# Patient Record
Sex: Male | Born: 1975 | Race: White | Hispanic: No | Smoking: Former smoker
Health system: Southern US, Community
[De-identification: ages and names within clinical notes are randomized; demographics above are authoritative.]

## PROBLEM LIST (undated history)

## (undated) DIAGNOSIS — Z9889 Other specified postprocedural states: Secondary | ICD-10-CM

## (undated) DIAGNOSIS — R931 Abnormal findings on diagnostic imaging of heart and coronary circulation: Secondary | ICD-10-CM

## (undated) HISTORY — PX: KNEE ARTHROSCOPY: SUR90

## (undated) HISTORY — DX: Other specified postprocedural states: Z98.890

## (undated) HISTORY — PX: NASAL SINUS SURGERY: SHX719

## (undated) HISTORY — DX: Abnormal findings on diagnostic imaging of heart and coronary circulation: R93.1

---

## 1998-08-15 HISTORY — PX: WISDOM TOOTH EXTRACTION: SHX21

## 2002-09-20 ENCOUNTER — Encounter: Admission: RE | Admit: 2002-09-20 | Discharge: 2002-10-09 | Payer: Self-pay | Admitting: Orthopedic Surgery

## 2008-08-15 DIAGNOSIS — Z9889 Other specified postprocedural states: Secondary | ICD-10-CM

## 2008-08-15 HISTORY — DX: Other specified postprocedural states: Z98.890

## 2010-01-04 LAB — HM SIGMOIDOSCOPY

## 2010-03-23 ENCOUNTER — Ambulatory Visit: Payer: Self-pay | Admitting: Cardiology

## 2010-03-23 DIAGNOSIS — R931 Abnormal findings on diagnostic imaging of heart and coronary circulation: Secondary | ICD-10-CM

## 2010-03-23 HISTORY — DX: Abnormal findings on diagnostic imaging of heart and coronary circulation: R93.1

## 2010-11-01 ENCOUNTER — Ambulatory Visit (INDEPENDENT_AMBULATORY_CARE_PROVIDER_SITE_OTHER): Payer: PRIVATE HEALTH INSURANCE | Admitting: Internal Medicine

## 2010-11-01 DIAGNOSIS — K5900 Constipation, unspecified: Secondary | ICD-10-CM

## 2014-09-27 LAB — VITAMIN D 25 HYDROXY (VIT D DEFICIENCY, FRACTURES): Vit D, 25-Hydroxy: 50.2

## 2016-11-07 ENCOUNTER — Encounter (HOSPITAL_COMMUNITY): Payer: Self-pay | Admitting: Emergency Medicine

## 2016-11-07 ENCOUNTER — Ambulatory Visit (HOSPITAL_COMMUNITY)
Admission: EM | Admit: 2016-11-07 | Discharge: 2016-11-07 | Disposition: A | Discharge status: Home / Self Care | Payer: 59

## 2016-11-07 DIAGNOSIS — J40 Bronchitis, not specified as acute or chronic: Secondary | ICD-10-CM

## 2016-11-07 DIAGNOSIS — H65193 Other acute nonsuppurative otitis media, bilateral: Secondary | ICD-10-CM

## 2016-11-07 MED ORDER — AMOXICILLIN 500 MG PO CAPS: 1000.0000 mg | ORAL_CAPSULE | Freq: Two times a day (BID) | ORAL | 0 refills | Status: DC

## 2016-11-07 MED ORDER — ALBUTEROL SULFATE HFA 108 (90 BASE) MCG/ACT IN AERS: 1.0000 | INHALATION_SPRAY | Freq: Four times a day (QID) | RESPIRATORY_TRACT | 0 refills | Status: DC | PRN

## 2016-11-07 MED ORDER — GUAIFENESIN-CODEINE 100-10 MG/5ML PO SOLN: 10.0000 mL | Freq: Three times a day (TID) | ORAL | 0 refills | Status: DC | PRN

## 2016-11-07 NOTE — ED Triage Notes (Signed)
The patient presented to the [facility] with a complaint of a sore throat, a cough and bilateral ear pressure x 5 days.

## 2016-11-07 NOTE — ED Provider Notes (Signed)
CSN: [id number]     Arrival date & time 11/07/16  1312 History   First MD Initiated Contact with Patient 11/07/16 1500     Chief Complaint  Patient presents with  . Sore Throat   (Consider location/radiation/quality/duration/timing/severity/associated sxs/prior Treatment) The history is provided by the patient.  Sore Throat  This is a new problem. The current episode started more than 2 days ago (5 days ago). The problem has been gradually worsening. Pertinent negatives include no shortness of breath. Treatments tried: Tessalon perls. The treatment provided no relief.    History reviewed. No pertinent past medical history. Past Surgical History:  Procedure Laterality Date  . NASAL SINUS SURGERY     History reviewed. No pertinent family history. Social History  Substance Use Topics  . Smoking status: Never Smoker  . Smokeless tobacco: Never Used  . Alcohol use No    Review of Systems  Constitutional: Positive for fatigue.  HENT: Positive for congestion, ear pain, postnasal drip and sore throat. Negative for trouble swallowing.   Respiratory: Positive for cough. Negative for chest tightness, shortness of breath and wheezing.     Allergies  Patient has no known allergies.  Home Medications   Prior to Admission medications   Medication Sig Start Date End Date Taking? Authorizing Provider  Magnesium 100 MG CAPS Take by mouth.   Yes Historical Provider, MD  albuterol (PROVENTIL HFA;VENTOLIN HFA) 108 (90 Base) MCG/ACT inhaler Inhale 1-2 puffs into the lungs every 6 (six) hours as needed for wheezing or shortness of breath. 11/07/16   Cohl Behrens, NP  amoxicillin (AMOXIL) 500 MG capsule Take 2 capsules (1,000 mg total) by mouth 2 (two) times daily. 11/07/16   Lailani Tool, NP  guaiFENesin-codeine 100-10 MG/5ML syrup Take 10 mLs by mouth 3 (three) times daily as needed for cough. 11/07/16   Toshika Parrow, NP   Meds Ordered and Administered this Visit  Medications - No  data to display  BP 111/78 (BP Location: Right Arm)   Pulse 95   Temp 98.3 F (36.8 C) (Oral)   Resp 18   SpO2 96%  No data found.   Physical Exam  Constitutional: He appears well-developed and well-nourished. He appears distressed.  HENT:  Right Ear: Hearing and ear canal normal. Tympanic membrane is injected (B/L erythemaotous TM , RIght worse than left ) and bulging.  Left Ear: Hearing and ear canal normal. Tympanic membrane is injected. Tympanic membrane is not bulging.  Mouth/Throat: Oropharynx is clear and moist. No oropharyngeal exudate, posterior oropharyngeal edema, posterior oropharyngeal erythema or tonsillar abscesses.  Eyes: Pupils are equal, round, and reactive to light.  Neck: Neck supple.  Cardiovascular: Normal rate, regular rhythm and normal heart sounds.   Pulmonary/Chest: Effort normal and breath sounds normal. No respiratory distress. He has no wheezes. He has no rales. He exhibits no tenderness.  Dry, persistent, non productive cough not relieved with Tessalon perls     Urgent Care Course     Procedures (including critical care time)  Labs Review Labs Reviewed - No data to display  Imaging Review No results found.   Visual Acuity Review  Right Eye Distance:   Left Eye Distance:   Bilateral Distance:    Right Eye Near:   Left Eye Near:    Bilateral Near:         MDM   1. Other acute nonsuppurative otitis media of both ears, recurrence not specified   2. Bronchitis       Rayetta Veith,  NP 11/07/16 1524

## 2016-11-07 NOTE — Discharge Instructions (Signed)
Saline nasal rinse and Allegra OTC as directed. Tylenol/Motrin as needed for pain/fever

## 2016-12-02 ENCOUNTER — Ambulatory Visit (INDEPENDENT_AMBULATORY_CARE_PROVIDER_SITE_OTHER): Payer: 59

## 2016-12-02 ENCOUNTER — Encounter: Payer: Self-pay | Admitting: Physician Assistant

## 2016-12-02 ENCOUNTER — Ambulatory Visit (INDEPENDENT_AMBULATORY_CARE_PROVIDER_SITE_OTHER): Payer: 59 | Admitting: Physician Assistant

## 2016-12-02 VITALS — BP 118/78 | HR 66 | Temp 98.1°F | Ht 70.5 in | Wt 179.2 lb

## 2016-12-02 DIAGNOSIS — R5383 Other fatigue: Secondary | ICD-10-CM | POA: Diagnosis not present

## 2016-12-02 DIAGNOSIS — J069 Acute upper respiratory infection, unspecified: Secondary | ICD-10-CM

## 2016-12-02 DIAGNOSIS — R05 Cough: Secondary | ICD-10-CM | POA: Diagnosis not present

## 2016-12-02 DIAGNOSIS — J029 Acute pharyngitis, unspecified: Secondary | ICD-10-CM | POA: Diagnosis not present

## 2016-12-02 LAB — CBC WITH DIFFERENTIAL/PLATELET
BASOS PCT: 0.5 % (ref 0.0–3.0)
Basophils Absolute: 0 10*3/uL (ref 0.0–0.1)
EOS PCT: 2.5 % (ref 0.0–5.0)
Eosinophils Absolute: 0.1 10*3/uL (ref 0.0–0.7)
HEMATOCRIT: 47.6 % (ref 39.0–52.0)
HEMOGLOBIN: 16.3 g/dL (ref 13.0–17.0)
LYMPHS PCT: 24.3 % (ref 12.0–46.0)
Lymphs Abs: 1.1 10*3/uL (ref 0.7–4.0)
MCHC: 34.2 g/dL (ref 30.0–36.0)
MCV: 90.7 fl (ref 78.0–100.0)
Monocytes Absolute: 0.5 10*3/uL (ref 0.1–1.0)
Monocytes Relative: 10.8 % (ref 3.0–12.0)
Neutro Abs: 2.7 10*3/uL (ref 1.4–7.7)
Neutrophils Relative %: 61.9 % (ref 43.0–77.0)
Platelets: 196 10*3/uL (ref 150.0–400.0)
RBC: 5.25 Mil/uL (ref 4.22–5.81)
RDW: 12.7 % (ref 11.5–15.5)
WBC: 4.4 10*3/uL (ref 4.0–10.5)

## 2016-12-02 LAB — BASIC METABOLIC PANEL
BUN: 12 mg/dL (ref 6–23)
CHLORIDE: 103 meq/L (ref 96–112)
CO2: 33 meq/L — AB (ref 19–32)
Calcium: 9.3 mg/dL (ref 8.4–10.5)
Creatinine, Ser: 0.93 mg/dL (ref 0.40–1.50)
GFR: 94.16 mL/min (ref >60.00)
Glucose, Bld: 95 mg/dL (ref 70–99)
POTASSIUM: 4 meq/L (ref 3.5–5.1)
Sodium: 139 mEq/L (ref 135–145)

## 2016-12-02 NOTE — Progress Notes (Signed)
Keith Choi is a 41 y.o. male here to Establish Care and c/o cough and sore throat x 4-5 weeks.  I acted as a Neurosurgeon for [employer], PA-C Corky Mull, LPN  History of Present Illness:   Chief Complaint  Patient presents with  . Establish Care  . Cough    expectorating green sputum  . Sore Throat  . Nasal Congestion  . Chest congestion  . Headache    1-2 x's per week  . Fatigue    Acute Concerns: URI -- went to urgent care on 11/07/16 was diagnosed with bilateral ear infection and bronchitis; prescribed an albuterol inhaler, amoxicillin and cheratussin. He states that he called a few days later to see if there was a stronger cough medicine that he could take and they gave him something else (I don't have record of this, likely either Hycodan or Tussionex) and he was also given a 5 day course of oral steroids but he did not take them. Over the past few days, his symptoms have continued to worsen, with deep cough and congestion. States that his appetite is good, well hydrated. He does endorse significant fatigue, but also admits that he works 50-60 hours a week and is in school full time. No hx of asthma. Was diagnosed with PNA in 2002 -- was able to treat outpatient.  Health Maintenance: Immunizations -- UTD Caffeine intake -- none Sleep habits -- sleeps 5-6 hours Weight -- Weight: 179 lb 4 oz (81.3 kg)   Depression screen PHQ 2/9 12/02/2016  Decreased Interest 0  Down, Depressed, Hopeless 0  PHQ - 2 Score 0    Other providers/specialists: Cardiologist -- saw once or twice for PVC's in 2010 Gastroenterologist -- saw once or twice for IBS - C Dermatologist -- acne    PMHx, SurgHx, SocialHx, Medications, and Allergies were reviewed in the Visit Navigator and updated as appropriate.  Current Medications:   Current Outpatient Prescriptions:  .  albuterol (PROVENTIL HFA;VENTOLIN HFA) 108 (90 Base) MCG/ACT inhaler, Inhale 1-2 puffs into the lungs every 6 (six) hours as  needed for wheezing or shortness of breath., Disp: 1 Inhaler, Rfl: 0 .  amoxicillin (AMOXIL) 500 MG capsule, Take 2 capsules (1,000 mg total) by mouth 2 (two) times daily., Disp: 40 capsule, Rfl: 0 .  guaiFENesin-codeine 100-10 MG/5ML syrup, Take 10 mLs by mouth 3 (three) times daily as needed for cough., Disp: 120 mL, Rfl: 0 .  Magnesium 100 MG CAPS, Take by mouth., Disp: , Rfl:    Review of Systems:   Review of Systems  Constitutional: Positive for malaise/fatigue.  HENT: Positive for congestion and sore throat.   Eyes: Negative.   Respiratory: Positive for cough and sputum production.   Cardiovascular: Negative.   Gastrointestinal: Negative.   Genitourinary: Negative.   Musculoskeletal: Negative.   Skin: Negative.   Neurological: Positive for headaches.  Endo/Heme/Allergies: Negative.   Psychiatric/Behavioral: Negative.     Vitals:   Vitals:   12/02/16 0832  BP: 118/78  Pulse: 66  Temp: 98.1 F (36.7 C)  TempSrc: Oral  SpO2: 97%  Weight: 179 lb 4 oz (81.3 kg)  Height: 5' 10.5" (1.791 m)     Body mass index is 25.36 kg/m.  Physical Exam:   Physical Exam  Constitutional: He appears well-developed. He is cooperative.  Non-toxic appearance. He does not have a sickly appearance. He does not appear ill. No distress.  HENT:  Head: Normocephalic and atraumatic.  Right Ear: Tympanic membrane, external ear and  ear canal normal. Tympanic membrane is not erythematous, not retracted and not bulging.  Left Ear: Tympanic membrane, external ear and ear canal normal. Tympanic membrane is not erythematous, not retracted and not bulging.  Nose: Nose normal. Right sinus exhibits no maxillary sinus tenderness and no frontal sinus tenderness. Left sinus exhibits no maxillary sinus tenderness and no frontal sinus tenderness.  Mouth/Throat: Uvula is midline. No posterior oropharyngeal edema or posterior oropharyngeal erythema. Tonsils are 0 on the right. Tonsils are 0 on the left.  Eyes:  Conjunctivae and lids are normal.  Neck: Trachea normal.  Cardiovascular: Normal rate, regular rhythm, S1 normal, S2 normal and normal heart sounds.   Pulmonary/Chest: Effort normal. No respiratory distress. He has no decreased breath sounds. He has no wheezes. He has rhonchi in the right upper field, the right middle field and the right lower field. He has no rales.  Lymphadenopathy:    He has no cervical adenopathy.  Neurological: He is alert.  Skin: Skin is warm, dry and intact.  Psychiatric: He has a normal mood and affect. His speech is normal and behavior is normal.  Nursing note and vitals reviewed.    Assessment and Plan:    Nathaniel was seen today for establish care, cough, sore throat, nasal congestion, chest congestion, headache and fatigue.  Diagnoses and all orders for this visit:  Fatigue, unspecified type Patient is concerned he may have mono. Will draw EBV panel. I suspect that he is getting inadequate rest, he works very long hours and is in school full time. We discussed importance of getting rest and staying hydrated. Will check labs. -     Basic metabolic panel -     CBC with Differential/Platelet -     Epstein-Barr virus VCA antibody panel  Upper respiratory tract infection, unspecified type Will obtain chest xray. Suspect bronchitis however want to r/o PNA. Will also obtain labs. Start Mucinex and steroid. Use inhaler as needed for coughing fits. Advised patient to let us know if he develops worsening symptoms.  -     Basic metabolic panel -     CBC with Differential/Platelet -     DG Chest 2 View; Future  Pharyngitis, unspecified etiology Patient is concerned he may have strep. Will check culture. -     Culture, Group A Strep    . Reviewed expectations re: course of current medical issues. . Discussed self-management of symptoms. . Outlined signs and symptoms indicating need for more acute intervention. . Patient verbalized understanding and all questions  were answered. . See orders for this visit as documented in the electronic medical record. . Patient received an After-Visit Summary.  CMA or LPN served as scribe during this visit. History, Physical, and Plan performed by medical provider. Documentation and orders reviewed and attested to.  Jarold Motto, PA-C

## 2016-12-02 NOTE — Patient Instructions (Signed)
It was great meeting you today!  Start mucinex and steroids. Cough medicine as needed. Stay hydrated and rest. We will call you with your lab results.  Please let us know if you develop fever, worsening shortness of breath or cough.   Upper Respiratory Infection, Adult Most upper respiratory infections (URIs) are a viral infection of the air passages leading to the lungs. A URI affects the nose, throat, and upper air passages. The most common type of URI is nasopharyngitis and is typically referred to as "the common cold." URIs run their course and usually go away on their own. Most of the time, a URI does not require medical attention, but sometimes a bacterial infection in the upper airways can follow a viral infection. This is called a secondary infection. Sinus and middle ear infections are common types of secondary upper respiratory infections. Bacterial pneumonia can also complicate a URI. A URI can worsen asthma and chronic obstructive pulmonary disease (COPD). Sometimes, these complications can require emergency medical care and may be life threatening. What are the causes? Almost all URIs are caused by viruses. A virus is a type of germ and can spread from one person to another. What increases the risk? You may be at risk for a URI if:  You smoke.  You have chronic heart or lung disease.  You have a weakened defense (immune) system.  You are very young or very old.  You have nasal allergies or asthma.  You work in crowded or poorly ventilated areas.  You work in health care facilities or schools. What are the signs or symptoms? Symptoms typically develop 2-3 days after you come in contact with a cold virus. Most viral URIs last 7-10 days. However, viral URIs from the influenza virus (flu virus) can last 14-18 days and are typically more severe. Symptoms may include:  Runny or stuffy (congested) nose.  Sneezing.  Cough.  Sore  throat.  Headache.  Fatigue.  Fever.  Loss of appetite.  Pain in your forehead, behind your eyes, and over your cheekbones (sinus pain).  Muscle aches. How is this diagnosed? Your health care provider may diagnose a URI by:  Physical exam.  Tests to check that your symptoms are not due to another condition such as:  Strep throat.  Sinusitis.  Pneumonia.  Asthma. How is this treated? A URI goes away on its own with time. It cannot be cured with medicines, but medicines may be prescribed or recommended to relieve symptoms. Medicines may help:  Reduce your fever.  Reduce your cough.  Relieve nasal congestion. Follow these instructions at home:  Take medicines only as directed by your health care provider.  Gargle warm saltwater or take cough drops to comfort your throat as directed by your health care provider.  Use a warm mist humidifier or inhale steam from a shower to increase air moisture. This may make it easier to breathe.  Drink enough fluid to keep your urine clear or pale yellow.  Eat soups and other clear broths and maintain good nutrition.  Rest as needed.  Return to work when your temperature has returned to normal or as your health care provider advises. You may need to stay home longer to avoid infecting others. You can also use a face mask and careful hand washing to prevent spread of the virus.  Increase the usage of your inhaler if you have asthma.  Do not use any tobacco products, including cigarettes, chewing tobacco, or electronic cigarettes. If you need  help quitting, ask your health care provider. How is this prevented? The best way to protect yourself from getting a cold is to practice good hygiene.  Avoid oral or hand contact with people with cold symptoms.  Wash your hands often if contact occurs. There is no clear evidence that vitamin C, vitamin E, echinacea, or exercise reduces the chance of developing a cold. However, it is always  recommended to get plenty of rest, exercise, and practice good nutrition. Contact a health care provider if:  You are getting worse rather than better.  Your symptoms are not controlled by medicine.  You have chills.  You have worsening shortness of breath.  You have brown or red mucus.  You have yellow or brown nasal discharge.  You have pain in your face, especially when you bend forward.  You have a fever.  You have swollen neck glands.  You have pain while swallowing.  You have white areas in the back of your throat. Get help right away if:  You have severe or persistent:  Headache.  Ear pain.  Sinus pain.  Chest pain.  You have chronic lung disease and any of the following:  Wheezing.  Prolonged cough.  Coughing up blood.  A change in your usual mucus.  You have a stiff neck.  You have changes in your:  Vision.  Hearing.  Thinking.  Mood. This information is not intended to replace advice given to you by your health care provider. Make sure you discuss any questions you have with your health care provider. Document Released: 01/25/2001 Document Revised: 04/03/2016 Document Reviewed: 11/06/2013 Elsevier Interactive Patient Education  2017 [employer].

## 2016-12-02 NOTE — Progress Notes (Signed)
Pre visit review using our clinic review tool, if applicable. No additional management support is needed unless otherwise documented below in the visit note. 

## 2016-12-04 LAB — CULTURE, GROUP A STREP

## 2016-12-05 LAB — EPSTEIN-BARR VIRUS VCA ANTIBODY PANEL
EBV NA IgG: 432 U/mL — ABNORMAL HIGH
EBV VCA IgG: >750 U/mL — ABNORMAL HIGH
EBV VCA IgM: <36 U/mL

## 2016-12-07 ENCOUNTER — Telehealth: Payer: Self-pay | Admitting: Physician Assistant

## 2016-12-07 NOTE — Telephone Encounter (Signed)
ROI faxed to [facility].

## 2016-12-08 ENCOUNTER — Telehealth: Payer: Self-pay | Admitting: Physician Assistant

## 2016-12-08 NOTE — Telephone Encounter (Signed)
Rec'd from [facility] forward 15 pages to [facility]

## 2016-12-12 ENCOUNTER — Encounter: Payer: Self-pay | Admitting: Physician Assistant

## 2016-12-12 LAB — TESTOSTERONE: Testosterone: 665

## 2016-12-13 ENCOUNTER — Encounter: Payer: Self-pay | Admitting: Physician Assistant

## 2016-12-15 ENCOUNTER — Telehealth: Payer: Self-pay | Admitting: Physician Assistant

## 2016-12-15 DIAGNOSIS — R0602 Shortness of breath: Secondary | ICD-10-CM

## 2016-12-15 DIAGNOSIS — R490 Dysphonia: Secondary | ICD-10-CM

## 2016-12-15 NOTE — Telephone Encounter (Signed)
Please see message and advise 

## 2016-12-15 NOTE — Telephone Encounter (Signed)
Left message on voicemail to call office. Need to know what symptoms pt is having?

## 2016-12-15 NOTE — Telephone Encounter (Signed)
What are patient's current symptoms? This will help me better decide between pulmonology and ENT.

## 2016-12-15 NOTE — Addendum Note (Signed)
Addended by: Jimmye NormanPHANOS, DONNA J on: 12/15/2016 03:19 PM   Modules accepted: Orders

## 2016-12-15 NOTE — Telephone Encounter (Signed)
Pt called back, asked him what symptoms he is having? Pt said still c/o hoarseness, SOB on exertion off and on, slight dry cough, no fever. Pt said the steriods helped but the minute he was off symptoms came back. He said he feels like he is breathing through a straw. Told pt discussed symptoms with Lelon MastSamantha and she is gong to send you to Pulmonary to be elevated. Pt verbalized understanding. Told pt someone will be contacting you for an appt. Pt verbalized understanding. Order for referral to Pulmonary done.

## 2016-12-15 NOTE — Telephone Encounter (Signed)
Patient states he is still not better since seeing provider, steroid and OTC meds not helping. Would like to know if he can get referral to an ENT or what next course of action is.

## 2016-12-19 ENCOUNTER — Encounter: Payer: Self-pay | Admitting: Physician Assistant

## 2016-12-22 ENCOUNTER — Ambulatory Visit (INDEPENDENT_AMBULATORY_CARE_PROVIDER_SITE_OTHER): Payer: 59

## 2016-12-22 DIAGNOSIS — R0602 Shortness of breath: Secondary | ICD-10-CM | POA: Diagnosis not present

## 2016-12-25 ENCOUNTER — Encounter: Payer: Self-pay | Admitting: Physician Assistant

## 2016-12-26 ENCOUNTER — Telehealth: Payer: Self-pay | Admitting: *Deleted

## 2016-12-26 ENCOUNTER — Encounter: Payer: Self-pay | Admitting: Physician Assistant

## 2016-12-26 NOTE — Telephone Encounter (Signed)
Left message on voicemail to call office. Need to schedule Nurse visit to have PFT done again.

## 2016-12-26 NOTE — Telephone Encounter (Signed)
Pt called back, told him the PF test we did was unfortunately lost and we will have to repeat the test. Told him I am so sorry. Pt verbalized understanding and said [place] 2 [place] and will call when he gets back to schedule. Told him okay.

## 2017-01-03 ENCOUNTER — Telehealth: Payer: Self-pay | Admitting: Physician Assistant

## 2017-01-03 NOTE — Telephone Encounter (Signed)
Patient's significant other called about trying to be seen at pulmonary care sooner. Stated patient was not doing any better.

## 2017-01-10 ENCOUNTER — Other Ambulatory Visit: Payer: 59

## 2017-01-12 ENCOUNTER — Encounter: Payer: Self-pay | Admitting: Physician Assistant

## 2017-01-12 ENCOUNTER — Other Ambulatory Visit: Payer: Self-pay | Admitting: Physician Assistant

## 2017-01-12 DIAGNOSIS — R070 Pain in throat: Secondary | ICD-10-CM

## 2017-01-17 ENCOUNTER — Institutional Professional Consult (permissible substitution): Payer: 59 | Admitting: Pulmonary Disease

## 2017-01-17 ENCOUNTER — Telehealth: Payer: Self-pay | Admitting: *Deleted

## 2017-01-17 NOTE — Telephone Encounter (Signed)
Patient called office stating his initial referral was sent to [facility]. Patient requested referral to be placed with a [place] system specialist.   [facility], and she faxed referral to Dr. Allene PyoNewman's office [address].   Patient is aware of referral and to anticipate hearing from his staff for scheduling of appointment with them.

## 2017-01-20 DIAGNOSIS — R490 Dysphonia: Secondary | ICD-10-CM | POA: Diagnosis not present

## 2017-02-08 DIAGNOSIS — R490 Dysphonia: Secondary | ICD-10-CM | POA: Diagnosis not present

## 2017-03-08 DIAGNOSIS — R490 Dysphonia: Secondary | ICD-10-CM | POA: Diagnosis not present

## 2017-03-14 ENCOUNTER — Encounter: Payer: Self-pay | Admitting: Physician Assistant

## 2017-03-17 ENCOUNTER — Other Ambulatory Visit: Payer: Self-pay | Admitting: Otolaryngology

## 2017-03-17 DIAGNOSIS — J329 Chronic sinusitis, unspecified: Secondary | ICD-10-CM

## 2017-03-17 DIAGNOSIS — J3800 Paralysis of vocal cords and larynx, unspecified: Secondary | ICD-10-CM

## 2017-05-12 ENCOUNTER — Encounter: Payer: Self-pay | Admitting: Physician Assistant

## 2017-05-12 ENCOUNTER — Ambulatory Visit (INDEPENDENT_AMBULATORY_CARE_PROVIDER_SITE_OTHER): Payer: 59 | Admitting: Physician Assistant

## 2017-05-12 VITALS — BP 122/78 | HR 69 | Temp 97.7°F | Wt 178.8 lb

## 2017-05-12 DIAGNOSIS — H1032 Unspecified acute conjunctivitis, left eye: Secondary | ICD-10-CM

## 2017-05-12 MED ORDER — POLYMYXIN B-TRIMETHOPRIM 10000-0.1 UNIT/ML-% OP SOLN: 1.0000 [drp] | OPHTHALMIC | 0 refills | Status: DC

## 2017-05-12 NOTE — Telephone Encounter (Signed)
Patient is being seen at 3pm with [facility] for pressure and itching in L eye.

## 2017-05-12 NOTE — Patient Instructions (Signed)
Use the eye drops as prescribed.  General instructions  Do not wear contact lenses until the inflammation is gone and your health care provider says it is safe to wear them again. Ask your health care provider how to sterilize or replace your contact lenses before you use them again. Wear glasses until you can resume wearing contacts.  Avoid wearing eye makeup until the inflammation is gone. Throw away any old eye cosmetics that may be contaminated.  Change or wash your pillowcase every day.  Do not share towels or washcloths. This may spread the infection.  Wash your hands often with soap and water. Use paper towels to dry your hands.  Avoid touching or rubbing your eyes.  Do not drive or use heavy machinery if your vision is blurred. Contact a health care provider if:  You have a fever.  Your symptoms do not get better after 10 days. Get help right away if:  You have a fever and your symptoms suddenly get worse.  You have severe pain when you move your eye.  You have facial pain, redness, or swelling.  You have sudden loss of vision.

## 2017-05-12 NOTE — Progress Notes (Signed)
Keith Choi is a 41 y.o. male here for a new problem.  History of Present Illness:   Chief Complaint  Patient presents with  . Eye Pain    itchy, red X1day    HPI   L eye redness -- yesterday, patient noticed that he was developing a pressure sensation, redness and pain to L eye. Very minimal crusting present this morning. No changes in vision. No recent URI. No exposure that he is aware of to bacterial conjunctivitis, however he is exposed to a variety of people through his work.  Does not wear contacts or glasses. No fevers. He has not tried any treatment for this.   Past Medical History:  Diagnosis Date  . Echocardiogram abnormal 03/23/2010   EF 60-65%; trace MR and TR  . Sigmoidoscopy performed 01/04/2010   normal except for small external hemorrhoids     Social History   Social History  . Marital status: Married    Spouse name: N/A  . Number of children: N/A  . Years of education: N/A   Occupational History  . Not on file.   Social History Main Topics  . Smoking status: Former Games developer  . Smokeless tobacco: Never Used     Comment: quit smoking about 20 years ago, also light smoking in 2012  . Alcohol use No  . Drug use: No  . Sexual activity: Yes   Other Topics Concern  . 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 KNEE ARTHROSCOPY Bilateral 2004, 2007   Right 2004, Left 2007  . NASAL SINUS SURGERY    . WISDOM TOOTH EXTRACTION Bilateral 2000    Family History  Problem Relation Age of Onset  . Diabetes Mother   . Lung cancer Maternal Grandfather   . Lung cancer Paternal Grandfather   . Prostate cancer Neg Hx   . Colon cancer Neg Hx   . Breast cancer Neg Hx     No Known Allergies  Current Medications:   Current Outpatient Prescriptions:  .   albuterol (PROVENTIL HFA;VENTOLIN HFA) 108 (90 Base) MCG/ACT inhaler, Inhale 1-2 puffs into the lungs every 6 (six) hours as needed for wheezing or shortness of breath., Disp: 1 Inhaler, Rfl: 0 .  Magnesium 100 MG CAPS, Take by mouth., Disp: , Rfl:  .  omeprazole (PRILOSEC) 40 MG capsule, TAKE ONE CAPSULE BY MOUTH BEFORE DINNER, Disp: , Rfl: 1 .  trimethoprim-polymyxin b (POLYTRIM) ophthalmic solution, Place 1 drop into the left eye every 4 (four) hours., Disp: 10 mL, Rfl: 0   Review of Systems:   Review of Systems  Constitutional: Negative for chills, fever, malaise/fatigue and weight loss.  HENT: Negative for ear pain, sinus pain and sore throat.   Eyes: Positive for pain, discharge and redness. Negative for blurred vision and double vision.  Respiratory: Negative for shortness of breath.   Cardiovascular: Negative for chest pain, orthopnea, claudication and leg swelling.  Gastrointestinal: Negative for heartburn, nausea and vomiting.  Neurological: Negative for dizziness, tingling and headaches.    Vitals:   Vitals:   05/12/17 1503  BP: 122/78  Pulse: 69  Temp: 97.7 F (36.5 C)  TempSrc: Oral  SpO2: 96%  Weight: 178 lb 12.8 oz (81.1 kg)     Body mass index is  25.29 kg/m.  Physical Exam:   Physical Exam  Constitutional: He appears well-developed. He is cooperative.  Non-toxic appearance. He does not have a sickly appearance. He does not appear ill. No distress.  HENT:  Head: Normocephalic and atraumatic.  Right Ear: Tympanic membrane, external ear and ear canal normal. Tympanic membrane is not erythematous, not retracted and not bulging.  Left Ear: Tympanic membrane, external ear and ear canal normal. Tympanic membrane is not erythematous, not retracted and not bulging.  Nose: Nose normal. Right sinus exhibits no maxillary sinus tenderness and no frontal sinus tenderness. Left sinus exhibits no maxillary sinus tenderness and no frontal sinus tenderness.  Mouth/Throat: Uvula  is midline. No posterior oropharyngeal edema or posterior oropharyngeal erythema.  Eyes: Lids are normal. Right eye exhibits no chemosis, no discharge and no exudate. Left eye exhibits no chemosis, no discharge and no exudate. Left conjunctiva is injected.  No abnormal EOM or pain elicited when testing EOM. L medial conjuctiva injected. R conjunctiva without injection or erythema. No evidence of hordeolum. No discharge or excessive tearing on exam.  Neck: Trachea normal.  Cardiovascular: Normal rate, regular rhythm, S1 normal, S2 normal and normal heart sounds.   Pulmonary/Chest: Effort normal and breath sounds normal. He has no decreased breath sounds. He has no wheezes. He has no rhonchi. He has no rales.  Lymphadenopathy:    He has no cervical adenopathy.       Right cervical: No superficial cervical, no deep cervical and no posterior cervical adenopathy present.      Left cervical: No superficial cervical, no deep cervical and no posterior cervical adenopathy present.  Neurological: He is alert.  Skin: Skin is warm, dry and intact.  Psychiatric: He has a normal mood and affect. His speech is normal and behavior is normal.  Nursing note and vitals reviewed.   Assessment and Plan:    Keith Choi was seen today for eye pain.  Diagnoses and all orders for this visit:  Acute conjunctivitis of left eye, unspecified acute conjunctivitis type Discussed treatment options. No history of recent contact use; will treat with polytrim per orders. Red flags provided on AVS. Return to office if lack of improvement or change in symptoms. Patient verbalized agreement to plan.  Other orders -     trimethoprim-polymyxin b (POLYTRIM) ophthalmic solution; Place 1 drop into the left eye every 4 (four) hours.  . Reviewed expectations re: course of current medical issues. . Discussed self-management of symptoms. . Outlined signs and symptoms indicating need for more acute intervention. . Patient verbalized  understanding and all questions were answered. . See orders for this visit as documented in the electronic medical record. . Patient received an After-Visit Summary.   Keith Motto, PA-C

## 2017-07-03 DIAGNOSIS — M7581 Other shoulder lesions, right shoulder: Secondary | ICD-10-CM | POA: Diagnosis not present

## 2017-07-11 DIAGNOSIS — M25511 Pain in right shoulder: Secondary | ICD-10-CM | POA: Diagnosis not present

## 2017-10-27 ENCOUNTER — Ambulatory Visit: Payer: Self-pay | Admitting: Nurse Practitioner

## 2017-10-27 ENCOUNTER — Telehealth: Payer: Self-pay

## 2017-10-27 DIAGNOSIS — Z111 Encounter for screening for respiratory tuberculosis: Secondary | ICD-10-CM

## 2017-10-27 NOTE — Progress Notes (Signed)
Patient presents for PPD placement for work. Denies previous positive TB test  Denies known exposure to TB   Tuberculin skin test applied to left ventral forearm.  Patient informed to return to [facility] in 48-72 hours for PPD read. Vaccine Information Statement provided to patient.   

## 2017-10-27 NOTE — Telephone Encounter (Signed)
Left message on voicemail to call office.  

## 2017-10-30 LAB — TB SKIN TEST
Induration: 0 mm
TB SKIN TEST: NEGATIVE

## 2017-10-30 NOTE — Progress Notes (Signed)
Came in for PPD reading today. Read by me as negative-0 mm  

## 2018-09-12 ENCOUNTER — Ambulatory Visit (INDEPENDENT_AMBULATORY_CARE_PROVIDER_SITE_OTHER): Payer: 59 | Admitting: Physician Assistant

## 2018-09-12 ENCOUNTER — Encounter: Payer: Self-pay | Admitting: Physician Assistant

## 2018-09-12 VITALS — BP 110/72 | HR 71 | Temp 98.5°F | Ht 70.5 in | Wt 185.0 lb

## 2018-09-12 DIAGNOSIS — R6882 Decreased libido: Secondary | ICD-10-CM

## 2018-09-12 DIAGNOSIS — Z23 Encounter for immunization: Secondary | ICD-10-CM

## 2018-09-12 DIAGNOSIS — Z13220 Encounter for screening for lipoid disorders: Secondary | ICD-10-CM

## 2018-09-12 DIAGNOSIS — Z0001 Encounter for general adult medical examination with abnormal findings: Secondary | ICD-10-CM

## 2018-09-12 DIAGNOSIS — Z136 Encounter for screening for cardiovascular disorders: Secondary | ICD-10-CM

## 2018-09-12 DIAGNOSIS — Z114 Encounter for screening for human immunodeficiency virus [HIV]: Secondary | ICD-10-CM

## 2018-09-12 LAB — LIPID PANEL
CHOL/HDL RATIO: 5
Cholesterol: 221 mg/dL — ABNORMAL HIGH (ref 0–200)
HDL: 43.2 mg/dL (ref >39.00)
LDL CALC: 164 mg/dL — AB (ref 0–99)
NONHDL: 178.24
Triglycerides: 72 mg/dL (ref 0.0–149.0)
VLDL: 14.4 mg/dL (ref 0.0–40.0)

## 2018-09-12 LAB — COMPREHENSIVE METABOLIC PANEL
ALK PHOS: 51 U/L (ref 39–117)
ALT: 26 U/L (ref 0–53)
AST: 23 U/L (ref 0–37)
Albumin: 4.2 g/dL (ref 3.5–5.2)
BUN: 13 mg/dL (ref 6–23)
CHLORIDE: 103 meq/L (ref 96–112)
CO2: 31 meq/L (ref 19–32)
Calcium: 9.4 mg/dL (ref 8.4–10.5)
Creatinine, Ser: 0.93 mg/dL (ref 0.40–1.50)
GFR: 87.87 mL/min (ref >60.00)
GLUCOSE: 88 mg/dL (ref 70–99)
POTASSIUM: 4.3 meq/L (ref 3.5–5.1)
SODIUM: 140 meq/L (ref 135–145)
TOTAL PROTEIN: 6.4 g/dL (ref 6.0–8.3)
Total Bilirubin: 0.7 mg/dL (ref 0.2–1.2)

## 2018-09-12 LAB — CBC WITH DIFFERENTIAL/PLATELET
BASOS ABS: 0 10*3/uL (ref 0.0–0.1)
Basophils Relative: 0.5 % (ref 0.0–3.0)
Eosinophils Absolute: 0.1 10*3/uL (ref 0.0–0.7)
Eosinophils Relative: 2.1 % (ref 0.0–5.0)
HCT: 46.7 % (ref 39.0–52.0)
Hemoglobin: 16.2 g/dL (ref 13.0–17.0)
LYMPHS PCT: 26 % (ref 12.0–46.0)
Lymphs Abs: 1.2 10*3/uL (ref 0.7–4.0)
MCHC: 34.7 g/dL (ref 30.0–36.0)
MCV: 90.7 fl (ref 78.0–100.0)
MONO ABS: 0.6 10*3/uL (ref 0.1–1.0)
Monocytes Relative: 12.3 % — ABNORMAL HIGH (ref 3.0–12.0)
NEUTROS PCT: 59.1 % (ref 43.0–77.0)
Neutro Abs: 2.7 10*3/uL (ref 1.4–7.7)
PLATELETS: 184 10*3/uL (ref 150.0–400.0)
RBC: 5.15 Mil/uL (ref 4.22–5.81)
RDW: 12.7 % (ref 11.5–15.5)
WBC: 4.5 10*3/uL (ref 4.0–10.5)

## 2018-09-12 LAB — TESTOSTERONE: TESTOSTERONE: 566.87 ng/dL (ref 300.00–890.00)

## 2018-09-12 NOTE — Progress Notes (Signed)
I acted as a Neurosurgeon for [employer], PA-C Corky Mull, LPN  Subjective:    Keith Choi is a 43 y.o. male and is here for a comprehensive physical exam.  HPI  Health Maintenance Due  Topic Date Due  . HIV Screening  09/21/1988    Acute Concerns: Decreased libido -- he has noticed with time that his sex drive has decreased. He would like his testosterone checked.  Chronic Issues: None  Health Maintenance: Immunizations -- UTD, will give Tdap today Colonoscopy -- N/A PSA -- N/A Diet -- smoothies, scrambled eggs, rice, chicken Sleep habits -- good sleeper for the most part Exercise -- 2 mi run vs yoga Weight -- Weight: 185 lb (83.9 kg)  Weight history Wt Readings from Last 10 Encounters:  09/12/18 185 lb (83.9 kg)  05/12/17 178 lb 12.8 oz (81.1 kg)  12/02/16 179 lb 4 oz (81.3 kg)  Mood -- overall good Tobacco use -- none Alcohol use --- a few beers a year  Depression screen PHQ 2/9 09/12/2018  Decreased Interest 0  Down, Depressed, Hopeless 0  PHQ - 2 Score 0   Doesn't see therapist regularly  Other providers/specialists: Patient Care Team: Jarold Motto, [place] as PCP - General (Physician Assistant)   PMHx, SurgHx, SocialHx, Medications, and Allergies were reviewed in the Visit Navigator and updated as appropriate.   Past Medical History:  Diagnosis Date  . Echocardiogram abnormal 03/23/2010   EF 60-65%; trace MR and TR  . Sigmoidoscopy performed 01/04/2010   normal except for small external hemorrhoids     Past Surgical History:  Procedure Laterality Date  . KNEE ARTHROSCOPY Bilateral 2004, 2007   Right 2004, Left 2007  . NASAL SINUS SURGERY    . WISDOM TOOTH EXTRACTION Bilateral 2000     Family History  Problem Relation Age of Onset  . Diabetes Mother   . Alcohol abuse Brother   . Lung cancer Maternal Grandfather   . Lung cancer Paternal Grandfather   . Prostate cancer Neg Hx   . Colon cancer Neg Hx   . Breast cancer Neg Hx      Social History   Tobacco Use  . Smoking status: Former Games developer  . Smokeless tobacco: Never Used  . Tobacco comment: quit smoking about 20 years ago, also light smoking in 2012  Substance Use Topics  . Alcohol use: No  . Drug use: No    Review of Systems:   Review of Systems  Constitutional: Negative.  Negative for chills, fever, malaise/fatigue and weight loss.  HENT: Negative.  Negative for hearing loss, sinus pain and sore throat.   Eyes: Negative.  Negative for blurred vision.  Respiratory: Negative.  Negative for cough and shortness of breath.   Cardiovascular: Negative.  Negative for chest pain, palpitations and leg swelling.  Gastrointestinal: Negative.  Negative for abdominal pain, constipation, diarrhea, heartburn, nausea and vomiting.  Genitourinary: Negative.  Negative for dysuria, frequency and urgency.  Musculoskeletal: Negative.  Negative for back pain, myalgias and neck pain.  Skin: Negative.  Negative for itching and rash.  Neurological: Negative.  Negative for dizziness, tingling, seizures, loss of consciousness and headaches.  Endo/Heme/Allergies: Negative.  Negative for polydipsia.  Psychiatric/Behavioral: Negative.  Negative for depression. The patient is not nervous/anxious.     Objective:   Vitals:   09/12/18 0743  BP: 110/72  Pulse: 71  Temp: 98.5 F (36.9 C)  SpO2: 97%   Body mass index is 26.17 kg/m.  General Appearance:  Alert,  cooperative, no distress, appears stated age  Head:  Normocephalic, without obvious abnormality, atraumatic  Eyes:  PERRL, conjunctiva/corneas clear, EOM's intact, fundi benign, both eyes       Ears:  Normal TM's and external ear canals, both ears  Nose: Nares normal, septum midline, mucosa normal, no drainage    or sinus tenderness  Throat: Lips, mucosa, and tongue normal; teeth and gums normal  Neck: Supple, symmetrical, trachea midline, no adenopathy; thyroid:  No enlargement/tenderness/nodules; no carotit bruit  or JVD  Back:   Symmetric, no curvature, ROM normal, no CVA tenderness  Lungs:   Clear to auscultation bilaterally, respirations unlabored  Chest wall:  No tenderness or deformity  Heart:  Regular rate and rhythm, S1 and S2 normal, no murmur, rub   or gallop  Abdomen:   Soft, non-tender, bowel sounds active all four quadrants, no masses, no organomegaly  Extremities: Extremities normal, atraumatic, no cyanosis or edema  Prostate: Not done.   Skin: Skin color, texture, turgor normal, no rashes or lesions  Lymph nodes: Cervical, supraclavicular, and axillary nodes normal  Neurologic: CNII-XII grossly intact. Normal strength, sensation and reflexes throughout    Assessment/Plan:   [name] was seen today for annual exam.  Diagnoses and all orders for this visit:  Encounter for general adult medical examination with abnormal findings Today patient counseled on age appropriate routine health concerns for screening and prevention, each reviewed and up to date or declined. Immunizations reviewed and up to date or declined. Labs ordered and reviewed. Risk factors for depression reviewed and negative. Hearing function and visual acuity are intact. ADLs screened and addressed as needed. Functional ability and level of safety reviewed and appropriate. Education, counseling and referrals performed based on assessed risks today. Patient provided with a copy of personalized plan for preventive services. -     CBC with Differential/Platelet -     Comprehensive metabolic panel  Need for prophylactic vaccination with combined diphtheria-tetanus-pertussis (DTP) vaccine -     Tdap vaccine greater than or equal to 7yo IM  Encounter for lipid screening for cardiovascular disease -     Lipid panel  Decreased sex drive Will re-check Testosterone (we have a baseline from 3-4 years ago that was normal.) Will refer to urology if abnormal. -     CBC with Differential/Platelet -     Comprehensive metabolic  panel -     Testosterone  Screening for HIV (human immunodeficiency virus) -     HIV Antibody (routine testing w rflx)  Well Adult Exam: Labs ordered: Yes. Patient counseling was done. See below for items discussed. Discussed the patient's BMI.  The BMI is in the acceptable range Follow up as needed for acute illness.  Patient Counseling: [x]   Nutrition: Stressed importance of moderation in sodium/caffeine intake, saturated fat and cholesterol, caloric balance, sufficient intake of fresh fruits, vegetables, and fiber.  [x]   Stressed the importance of regular exercise.   []   Substance Abuse: Discussed cessation/primary prevention of tobacco, alcohol, or other drug use; driving or other dangerous activities under the influence; availability of treatment for abuse.   [x]   Injury prevention: Discussed safety belts, safety helmets, smoke detector, smoking near bedding or upholstery.   []   Sexuality: Discussed sexually transmitted diseases, partner selection, use of condoms, avoidance of unintended pregnancy  and contraceptive alternatives.   [x]   Dental health: Discussed importance of regular tooth brushing, flossing, and dental visits.  [x]   Health maintenance and immunizations reviewed. Please refer to Health maintenance section.  CMA or LPN served as scribe during this visit. History, Physical, and Plan performed by medical provider. The above documentation has been reviewed and is accurate and complete.  Jarold MottoSamantha Eldon Zietlow, PA-C [facility]

## 2018-09-12 NOTE — Patient Instructions (Signed)
It was great to see you!  Please go to the lab for blood work.   Our office will call you with your results unless you have chosen to receive results via MyChart.  If your blood work is normal we will follow-up each year for physicals and as scheduled for chronic medical problems.  If anything is abnormal we will treat accordingly and get you in for a follow-up.  Take care,  Keith Choi    Health Maintenance, Male A healthy lifestyle and preventive care is important for your health and wellness. Ask your health care provider about what schedule of regular examinations is right for you. What should I know about weight and diet? Eat a Healthy Diet  Eat plenty of vegetables, fruits, whole grains, low-fat dairy products, and lean protein.  Do not eat a lot of foods high in solid fats, added sugars, or salt.  Maintain a Healthy Weight Regular exercise can help you achieve or maintain a healthy weight. You should:  Do at least 150 minutes of exercise each week. The exercise should increase your heart rate and make you sweat (moderate-intensity exercise).  Do strength-training exercises at least twice a week. Watch Your Levels of Cholesterol and Blood Lipids  Have your blood tested for lipids and cholesterol every 5 years starting at 45 years of age. If you are at high risk for heart disease, you should start having your blood tested when you are 45 years old. You may need to have your cholesterol levels checked more often if: ? Your lipid or cholesterol levels are high. ? You are older than 45 years of age. ? You are at high risk for heart disease. What should I know about cancer screening? Many types of cancers can be detected early and may often be prevented. Lung Cancer  You should be screened every year for lung cancer if: ? You are a current smoker who has smoked for at least 30 years. ? You are a former smoker who has quit within the past 15 years.  Talk to your health care  provider about your screening options, when you should start screening, and how often you should be screened. Colorectal Cancer  Routine colorectal cancer screening usually begins at 45 years of age and should be repeated every 5-10 years until you are 45 years old. You may need to be screened more often if early forms of precancerous polyps or small growths are found. Your health care provider may recommend screening at an earlier age if you have risk factors for colon cancer.  Your health care provider may recommend using home test kits to check for hidden blood in the stool.  A small camera at the end of a tube can be used to examine your colon (sigmoidoscopy or colonoscopy). This checks for the earliest forms of colorectal cancer. Prostate and Testicular Cancer  Depending on your age and overall health, your health care provider may do certain tests to screen for prostate and testicular cancer.  Talk to your health care provider about any symptoms or concerns you have about testicular or prostate cancer. Skin Cancer  Check your skin from head to toe regularly.  Tell your health care provider about any new moles or changes in moles, especially if: ? There is a change in a mole's size, shape, or color. ? You have a mole that is larger than a pencil eraser.  Always use sunscreen. Apply sunscreen liberally and repeat throughout the day.  Protect yourself by wearing   long sleeves, pants, a wide-brimmed hat, and sunglasses when outside. What should I know about heart disease, diabetes, and high blood pressure?  If you are 18-39 years of age, have your blood pressure checked every 3-5 years. If you are 40 years of age or older, have your blood pressure checked every year. You should have your blood pressure measured twice-once when you are at a hospital or clinic, and once when you are not at a hospital or clinic. Record the average of the two measurements. To check your blood pressure when you  are not at a hospital or clinic, you can use: ? An automated blood pressure machine at a pharmacy. ? A home blood pressure monitor.  Talk to your health care provider about your target blood pressure.  If you are between 45-79 years old, ask your health care provider if you should take aspirin to prevent heart disease.  Have regular diabetes screenings by checking your fasting blood sugar level. ? If you are at a normal weight and have a low risk for diabetes, have this test once every three years after the age of 45. ? If you are overweight and have a high risk for diabetes, consider being tested at a younger age or more often.  A one-time screening for abdominal aortic aneurysm (AAA) by ultrasound is recommended for men aged 65-75 years who are current or former smokers. What should I know about preventing infection? Hepatitis B If you have a higher risk for hepatitis B, you should be screened for this virus. Talk with your health care provider to find out if you are at risk for hepatitis B infection. Hepatitis C Blood testing is recommended for:  Everyone born from 1945 through 1965.  Anyone with known risk factors for hepatitis C. Sexually Transmitted Diseases (STDs)  You should be screened each year for STDs including gonorrhea and chlamydia if: ? You are sexually active and are younger than 45 years of age. ? You are older than 45 years of age and your health care provider tells you that you are at risk for this type of infection. ? Your sexual activity has changed since you were last screened and you are at an increased risk for chlamydia or gonorrhea. Ask your health care provider if you are at risk.  Talk with your health care provider about whether you are at high risk of being infected with HIV. Your health care provider may recommend a prescription medicine to help prevent HIV infection. What else can I do?  Schedule regular health, dental, and eye exams.  Stay current  with your vaccines (immunizations).  Do not use any tobacco products, such as cigarettes, chewing tobacco, and e-cigarettes. If you need help quitting, ask your health care provider.  Limit alcohol intake to no more than 2 drinks per day. One drink equals 12 ounces of beer, 5 ounces of wine, or 1 ounces of hard liquor.  Do not use street drugs.  Do not share needles.  Ask your health care provider for help if you need support or information about quitting drugs.  Tell your health care provider if you often feel depressed.  Tell your health care provider if you have ever been abused or do not feel safe at home. This information is not intended to replace advice given to you by your health care provider. Make sure you discuss any questions you have with your health care provider. Document Released: 01/28/2008 Document Revised: 03/30/2016 Document Reviewed: 05/05/2015 Elsevier Interactive   Patient Education  2019 [employer]  

## 2018-09-13 LAB — HIV ANTIBODY (ROUTINE TESTING W REFLEX): HIV: NONREACTIVE

## 2019-02-24 IMAGING — DX DG CHEST 2V
2 series · 2 of 2 positions shown · non-contrast
Comparison: None.

CLINICAL DATA: 41 y/o male with productive cough x 4-5 weeks. Hx
non-smoker

EXAM:
CHEST  2 VIEW

[chest pa (1 of 2)]
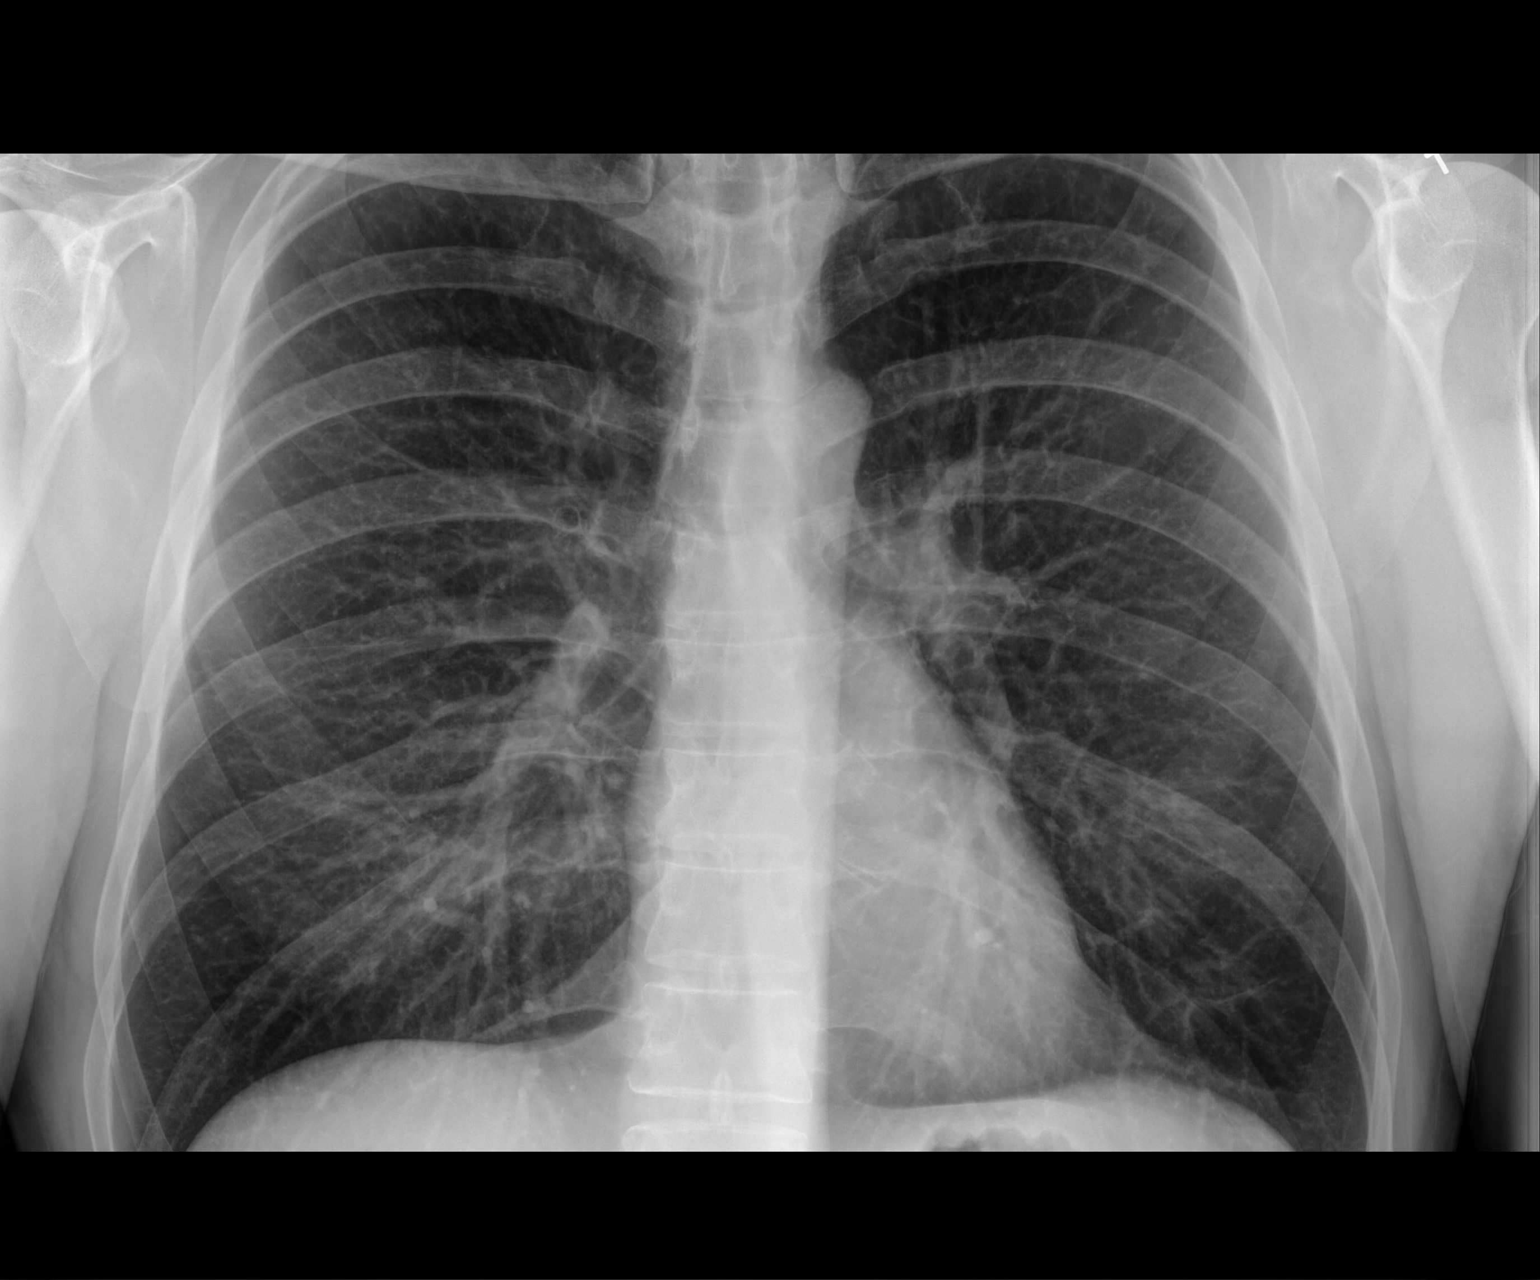

[chest pa (2 of 2)]
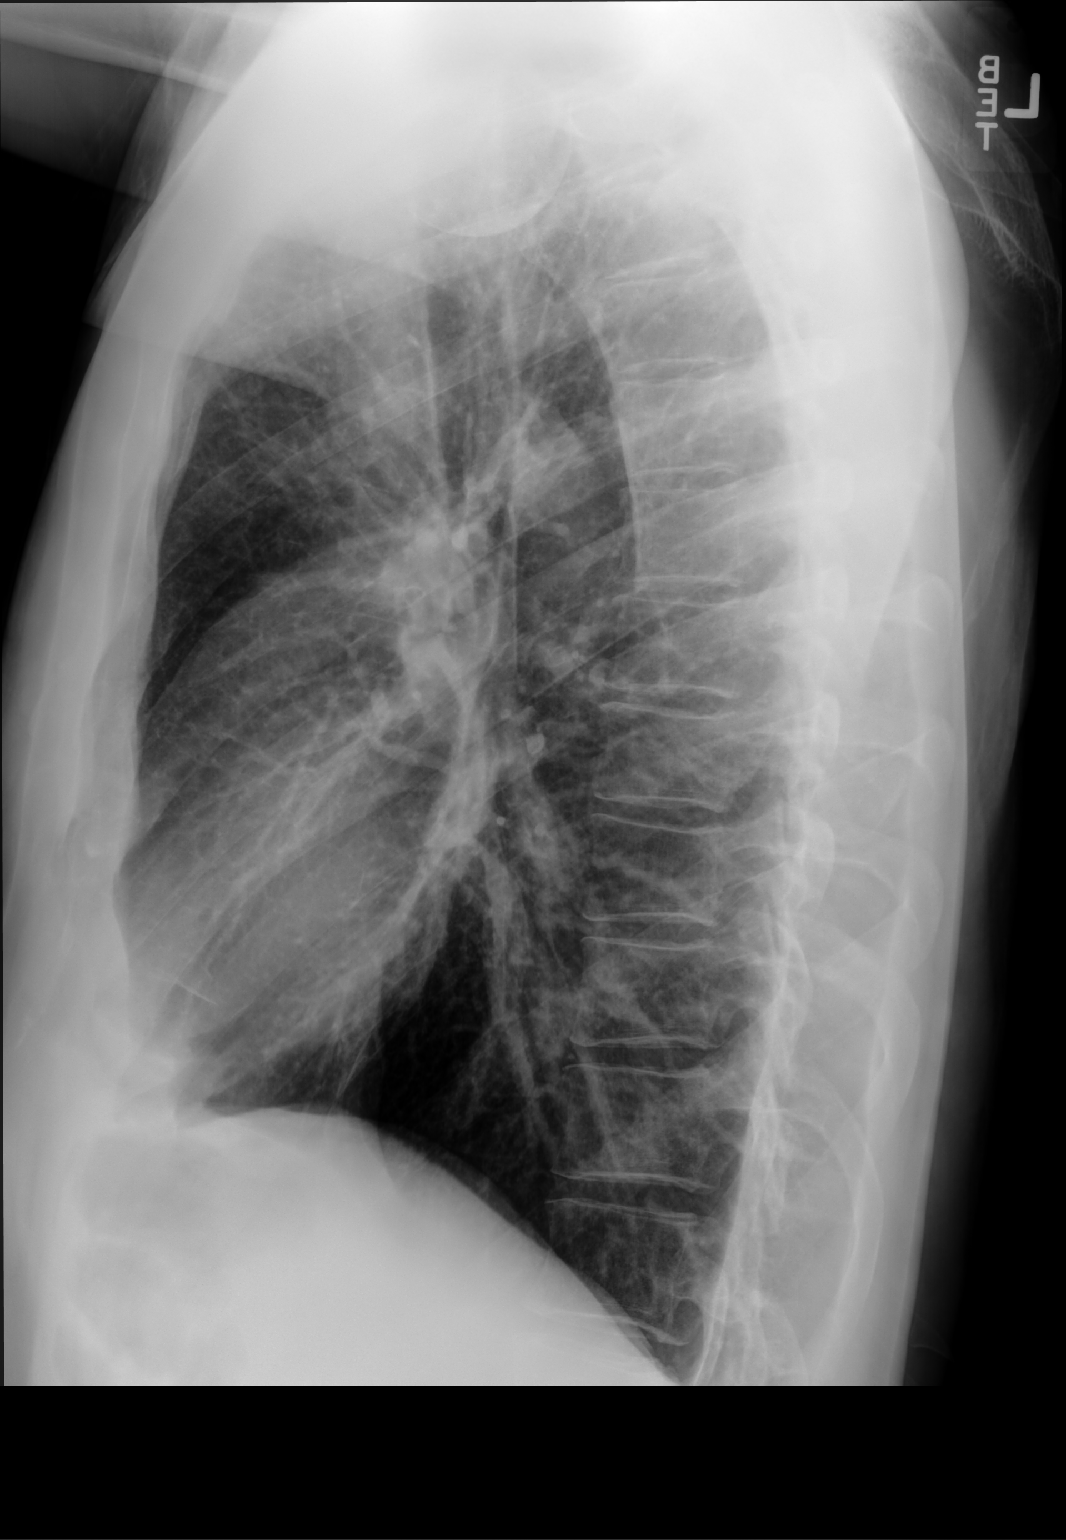

[2 of 2 positions shown; findings below may reference images not displayed]

FINDINGS: The heart size and mediastinal contours are within normal limits.
Both lungs are clear. No pleural effusion or pneumothorax. The
visualized skeletal structures are unremarkable.
IMPRESSION: No active cardiopulmonary disease.

## 2022-05-09 ENCOUNTER — Encounter: Payer: Self-pay | Admitting: *Deleted

## 2022-07-28 ENCOUNTER — Encounter: Payer: Self-pay | Admitting: *Deleted
